# Patient Record
Sex: Female | Born: 1995
Health system: Southern US, Community
[De-identification: ages and names within clinical notes are randomized; demographics above are authoritative.]

## PROBLEM LIST (undated history)

## (undated) DIAGNOSIS — R51 Headache: Secondary | ICD-10-CM

## (undated) DIAGNOSIS — Q431 Hirschsprung's disease: Secondary | ICD-10-CM

## (undated) DIAGNOSIS — R519 Headache, unspecified: Secondary | ICD-10-CM

## (undated) DIAGNOSIS — T7840XA Allergy, unspecified, initial encounter: Secondary | ICD-10-CM

## (undated) DIAGNOSIS — N200 Calculus of kidney: Secondary | ICD-10-CM

## (undated) HISTORY — DX: Headache, unspecified: R51.9

## (undated) HISTORY — DX: Allergy, unspecified, initial encounter: T78.40XA

## (undated) HISTORY — DX: Headache: R51

## (undated) HISTORY — DX: Hirschsprung's disease: Q43.1

## (undated) HISTORY — DX: Calculus of kidney: N20.0

---
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
## 2018-11-24 DIAGNOSIS — J029 Acute pharyngitis, unspecified: Secondary | ICD-10-CM | POA: Diagnosis not present

## 2019-11-12 DIAGNOSIS — F064 Anxiety disorder due to known physiological condition: Secondary | ICD-10-CM | POA: Diagnosis not present

## 2019-11-12 DIAGNOSIS — Z30011 Encounter for initial prescription of contraceptive pills: Secondary | ICD-10-CM | POA: Diagnosis not present

## 2019-11-12 DIAGNOSIS — Z6827 Body mass index (BMI) 27.0-27.9, adult: Secondary | ICD-10-CM | POA: Diagnosis not present

## 2020-12-15 ENCOUNTER — Other Ambulatory Visit: Payer: Self-pay | Admitting: Family Medicine

## 2020-12-15 ENCOUNTER — Other Ambulatory Visit (HOSPITAL_COMMUNITY)
Admission: RE | Admit: 2020-12-15 | Discharge: 2020-12-15 | Disposition: A | Discharge status: Home / Self Care | Payer: BC Managed Care – PPO | Source: Ambulatory Visit | Attending: Family Medicine | Admitting: Family Medicine

## 2020-12-15 DIAGNOSIS — N898 Other specified noninflammatory disorders of vagina: Secondary | ICD-10-CM | POA: Insufficient documentation

## 2020-12-17 LAB — MOLECULAR ANCILLARY ONLY
Bacterial Vaginitis (gardnerella): POSITIVE — AB
Candida Glabrata: NEGATIVE
Candida Vaginitis: NEGATIVE
Comment: NEGATIVE
Comment: NEGATIVE
Comment: NEGATIVE

## 2022-09-14 ENCOUNTER — Ambulatory Visit (HOSPITAL_COMMUNITY): Payer: Self-pay

## 2022-09-19 ENCOUNTER — Other Ambulatory Visit: Payer: Self-pay | Admitting: Family Medicine

## 2022-09-19 DIAGNOSIS — R319 Hematuria, unspecified: Secondary | ICD-10-CM

## 2022-09-21 ENCOUNTER — Other Ambulatory Visit: Payer: Self-pay | Admitting: Family Medicine

## 2022-09-21 ENCOUNTER — Other Ambulatory Visit (HOSPITAL_COMMUNITY)
Admission: RE | Admit: 2022-09-21 | Discharge: 2022-09-21 | Disposition: A | Discharge status: Home / Self Care | Payer: BC Managed Care – PPO | Source: Ambulatory Visit | Attending: Family Medicine | Admitting: Family Medicine

## 2022-09-21 DIAGNOSIS — Z113 Encounter for screening for infections with a predominantly sexual mode of transmission: Secondary | ICD-10-CM | POA: Diagnosis present

## 2022-09-21 DIAGNOSIS — Z124 Encounter for screening for malignant neoplasm of cervix: Secondary | ICD-10-CM | POA: Diagnosis not present

## 2022-09-23 LAB — MOLECULAR ANCILLARY ONLY
Bacterial Vaginitis (gardnerella): POSITIVE — AB
Candida Glabrata: NEGATIVE
Candida Vaginitis: NEGATIVE
Chlamydia: NEGATIVE
Comment: NEGATIVE
Comment: NEGATIVE
Comment: NEGATIVE
Comment: NEGATIVE
Comment: NEGATIVE
Comment: NORMAL
Neisseria Gonorrhea: NEGATIVE
Trichomonas: NEGATIVE

## 2022-09-26 LAB — CYTOLOGY - PAP
Comment: NEGATIVE
Diagnosis: NEGATIVE
High risk HPV: NEGATIVE

## 2022-09-29 ENCOUNTER — Other Ambulatory Visit (HOSPITAL_BASED_OUTPATIENT_CLINIC_OR_DEPARTMENT_OTHER): Payer: Self-pay

## 2022-09-30 ENCOUNTER — Other Ambulatory Visit (HOSPITAL_COMMUNITY): Payer: Self-pay

## 2022-09-30 MED ORDER — ZEPBOUND 5 MG/0.5ML ~~LOC~~ SOAJ
5.0000 mg | SUBCUTANEOUS | 0 refills | Status: DC
  Filled 2022-09-30 (×2): qty 2, 28d supply, fill #0

## 2022-10-04 ENCOUNTER — Other Ambulatory Visit (HOSPITAL_COMMUNITY): Payer: Self-pay

## 2022-10-04 ENCOUNTER — Ambulatory Visit
Admission: RE | Admit: 2022-10-04 | Discharge: 2022-10-04 | Disposition: A | Discharge status: Home / Self Care | Payer: BC Managed Care – PPO | Source: Ambulatory Visit | Attending: Family Medicine | Admitting: Family Medicine

## 2022-10-04 DIAGNOSIS — R319 Hematuria, unspecified: Secondary | ICD-10-CM

## 2022-10-04 MED ORDER — METRONIDAZOLE 500 MG PO TABS
500.0000 mg | ORAL_TABLET | Freq: Two times a day (BID) | ORAL | 0 refills | Status: DC
  Filled 2022-10-04: qty 14, 7d supply, fill #0

## 2022-10-13 ENCOUNTER — Other Ambulatory Visit (HOSPITAL_COMMUNITY): Payer: Self-pay

## 2022-11-06 ENCOUNTER — Ambulatory Visit
Admission: EM | Admit: 2022-11-06 | Discharge: 2022-11-06 | Disposition: A | Discharge status: Home / Self Care | Payer: BC Managed Care – PPO | Attending: Emergency Medicine | Admitting: Emergency Medicine

## 2022-11-06 DIAGNOSIS — J101 Influenza due to other identified influenza virus with other respiratory manifestations: Secondary | ICD-10-CM | POA: Diagnosis not present

## 2022-11-06 LAB — POCT RAPID STREP A (OFFICE): Rapid Strep A Screen: NEGATIVE

## 2022-11-06 LAB — POCT INFLUENZA A/B
Influenza A, POC: NEGATIVE
Influenza B, POC: POSITIVE — AB

## 2022-11-06 MED ORDER — OSELTAMIVIR PHOSPHATE 75 MG PO CAPS: 75.0000 mg | ORAL_CAPSULE | Freq: Two times a day (BID) | ORAL | 0 refills | Status: DC

## 2022-11-06 MED ORDER — ACETAMINOPHEN 325 MG PO TABS
650.0000 mg | ORAL_TABLET | Freq: Once | ORAL | Status: AC
  Administered 2022-11-06: 650 mg via ORAL

## 2022-11-06 NOTE — Discharge Instructions (Addendum)
Take the Tamiflu as directed.    Take Tylenol or ibuprofen as needed for fever or discomfort.  Rest and keep yourself hydrated.    Follow-up with your primary care provider if your symptoms are not improving.

## 2022-11-06 NOTE — ED Triage Notes (Signed)
Patient to Urgent Care with complaints of generalized body aches, cough/ headache, fevers, and sore throat.  Symptoms started yesterday. Last dose of medication yesterday.

## 2022-11-06 NOTE — ED Provider Notes (Signed)
Roderic Palau    CSN: [id number] Arrival date & time: 11/06/22  [id number]      History   Chief Complaint Chief Complaint  Patient presents with   Sore Throat   Fever    HPI Jasmine Mcmahon is a 27 y.o. female.  Patient presents with fever, chills, body aches, headache, sore throat, cough x 1 day.  No OTC medications taken today.  She denies rash, chest pain, shortness of breath, vomiting, diarrhea, or other symptoms.  Her medical history includes Hirschsprung's disease, seasonal allergies, headache. The history is provided by the patient and medical records.    Past Medical History:  Diagnosis Date   Allergy    Headache    Hirschsprung's disease     There are no problems to display for this patient.   History reviewed. No pertinent surgical history.  OB History   No obstetric history on file.      Home Medications    Prior to Admission medications   Medication Sig Start Date End Date Taking? Authorizing Provider  oseltamivir (TAMIFLU) 75 MG capsule Take 1 capsule (75 mg total) by mouth every 12 (twelve) hours. 11/06/22  Yes Sharion Balloon, NP  LO LOESTRIN FE 1 MG-10 MCG / 10 MCG tablet Take 1 tablet by mouth daily. Patient not taking: Reported on 11/06/2022 09/01/16   Shelly Bombard, MD  metroNIDAZOLE (FLAGYL) 500 MG tablet Take 1 tablet (500 mg total) by mouth 2 (two) times daily. Patient not taking: Reported on 11/06/2022 10/03/22     ondansetron (ZOFRAN-ODT) 8 MG disintegrating tablet Take 1 tablet (8 mg total) by mouth every 8 (eight) hours as needed for nausea. 11/01/15   Wardell Honour, MD  tirzepatide [facility]) 5 MG/0.5ML Pen Inject 5 mg into the skin once a week. Patient not taking: Reported on 11/06/2022 09/29/22       Family History Family History  Problem Relation Age of Onset   Diabetes Mother    Hypertension Mother    Depression Mother     Social History Social History   Tobacco Use   Smoking status: Never   Smokeless tobacco: Never   Substance Use Topics   Alcohol use: No    Alcohol/week: 0.0 standard drinks of alcohol   Drug use: No     Allergies   Patient has no known allergies.   Review of Systems Review of Systems  Constitutional:  Positive for chills and fever.  HENT:  Positive for sore throat. Negative for ear pain.   Respiratory:  Positive for cough. Negative for shortness of breath.   Cardiovascular:  Negative for chest pain and palpitations.  Gastrointestinal:  Negative for abdominal pain, diarrhea and vomiting.  Skin:  Negative for color change and rash.  Neurological:  Positive for headaches.  All other systems reviewed and are negative.    Physical Exam Triage Vital Signs ED Triage Vitals  Enc Vitals Group     BP 11/06/22 0924 105/70     Pulse Rate 11/06/22 0901 (!) 107     Resp 11/06/22 0901 18     Temp 11/06/22 0901 (!) 103 F (39.4 C)     Temp src --      SpO2 11/06/22 0901 97 %     Weight --      Height --      Head Circumference --      Peak Flow --      Pain Score 11/06/22 0914 8  Pain Loc --      Pain Edu? --      Excl. in [place]? --    No data found.  Updated Vital Signs BP 105/70   Pulse (!) 107   Temp 99.5 F (37.5 C)   Resp 18   LMP 10/02/2022   SpO2 97%   Visual Acuity Right Eye Distance:   Left Eye Distance:   Bilateral Distance:    Right Eye Near:   Left Eye Near:    Bilateral Near:     Physical Exam Vitals and nursing note reviewed.  Constitutional:      General: She is not in acute distress.    Appearance: She is well-developed. She is ill-appearing.  HENT:     Right Ear: Tympanic membrane normal.     Left Ear: Tympanic membrane normal.     Nose: Nose normal.     Mouth/Throat:     Mouth: Mucous membranes are moist.     Pharynx: Oropharynx is clear.  Cardiovascular:     Rate and Rhythm: Normal rate and regular rhythm.     Heart sounds: Normal heart sounds.  Pulmonary:     Effort: Pulmonary effort is normal. No respiratory distress.      Breath sounds: Normal breath sounds.  Musculoskeletal:     Cervical back: Neck supple.  Skin:    General: Skin is warm and dry.  Neurological:     Mental Status: She is alert.  Psychiatric:        Mood and Affect: Mood normal.        Behavior: Behavior normal.      UC Treatments / Results  Labs (all labs ordered are listed, but only abnormal results are displayed) Labs Reviewed  POCT INFLUENZA A/B - Abnormal; Notable for the following components:      Result Value   Influenza B, POC Positive (*)    All other components within normal limits  POCT RAPID STREP A (OFFICE)    EKG   Radiology No results found.  Procedures Procedures (including critical care time)  Medications Ordered in UC Medications  acetaminophen (TYLENOL) tablet 650 mg (650 mg Oral Given 11/06/22 0925)    Initial Impression / Assessment and Plan / UC Course  I have reviewed the triage vital signs and the nursing notes.  Pertinent labs & imaging results that were available during my care of the patient were reviewed by me and considered in my medical decision making (see chart for details).    Influenza B.  Rapid influenza test positive for B.  Treating with Tamiflu.   Discussed symptomatic treatment including Tylenol, rest, hydration.  Instructed patient to follow up with her PCP if symptoms are not improving.  ED precautions given.  Education provided on influenza.  She agrees to plan of care.   Final Clinical Impressions(s) / UC Diagnoses   Final diagnoses:  Influenza B     Discharge Instructions      Take the Tamiflu as directed.    Take Tylenol or ibuprofen as needed for fever or discomfort.  Rest and keep yourself hydrated.    Follow-up with your primary care provider if your symptoms are not improving.         ED Prescriptions     Medication Sig Dispense Auth. Provider   oseltamivir (TAMIFLU) 75 MG capsule Take 1 capsule (75 mg total) by mouth every 12 (twelve) hours. 10  capsule Sharion Balloon, NP      PDMP not  reviewed this encounter.   Sharion Balloon, NP 11/06/22 [phone]

## 2022-11-14 ENCOUNTER — Other Ambulatory Visit: Payer: Self-pay

## 2022-11-14 ENCOUNTER — Emergency Department (HOSPITAL_COMMUNITY)
Admission: EM | Admit: 2022-11-14 | Discharge: 2022-11-14 | Disposition: A | Discharge status: Home / Self Care | Payer: BC Managed Care – PPO | Attending: Emergency Medicine | Admitting: Emergency Medicine

## 2022-11-14 ENCOUNTER — Encounter (HOSPITAL_COMMUNITY): Payer: Self-pay

## 2022-11-14 ENCOUNTER — Emergency Department (HOSPITAL_COMMUNITY): Payer: BC Managed Care – PPO

## 2022-11-14 DIAGNOSIS — N201 Calculus of ureter: Secondary | ICD-10-CM | POA: Diagnosis not present

## 2022-11-14 DIAGNOSIS — M545 Low back pain, unspecified: Secondary | ICD-10-CM | POA: Diagnosis present

## 2022-11-14 DIAGNOSIS — N132 Hydronephrosis with renal and ureteral calculous obstruction: Secondary | ICD-10-CM

## 2022-11-14 DIAGNOSIS — N1330 Unspecified hydronephrosis: Secondary | ICD-10-CM | POA: Diagnosis not present

## 2022-11-14 LAB — BASIC METABOLIC PANEL
Anion gap: 12 (ref 5–15)
BUN: 8 mg/dL (ref 6–20)
CO2: 22 mmol/L (ref 22–32)
Calcium: 9.4 mg/dL (ref 8.9–10.3)
Chloride: 104 mmol/L (ref 98–111)
Creatinine, Ser: 0.93 mg/dL (ref 0.44–1.00)
GFR, Estimated: >60 mL/min (ref >60)
Glucose, Bld: 94 mg/dL (ref 70–99)
Potassium: 3.3 mmol/L — ABNORMAL LOW (ref 3.5–5.1)
Sodium: 138 mmol/L (ref 135–145)

## 2022-11-14 LAB — URINALYSIS, ROUTINE W REFLEX MICROSCOPIC
Bacteria, UA: NONE SEEN
Bilirubin Urine: NEGATIVE
Glucose, UA: NEGATIVE mg/dL
Ketones, ur: 80 mg/dL — AB
Nitrite: NEGATIVE
Protein, ur: 30 mg/dL — AB
RBC / HPF: >50 RBC/hpf (ref 0–5)
Specific Gravity, Urine: 1.018 (ref 1.005–1.030)
pH: 6 (ref 5.0–8.0)

## 2022-11-14 LAB — CBC WITH DIFFERENTIAL/PLATELET
Abs Immature Granulocytes: 0.08 10*3/uL — ABNORMAL HIGH (ref 0.00–0.07)
Basophils Absolute: 0 10*3/uL (ref 0.0–0.1)
Basophils Relative: 0 %
Eosinophils Absolute: 0 10*3/uL (ref 0.0–0.5)
Eosinophils Relative: 0 %
HCT: 35.4 % — ABNORMAL LOW (ref 36.0–46.0)
Hemoglobin: 11.8 g/dL — ABNORMAL LOW (ref 12.0–15.0)
Immature Granulocytes: 1 %
Lymphocytes Relative: 21 %
Lymphs Abs: 2.2 10*3/uL (ref 0.7–4.0)
MCH: 30.2 pg (ref 26.0–34.0)
MCHC: 33.3 g/dL (ref 30.0–36.0)
MCV: 90.5 fL (ref 80.0–100.0)
Monocytes Absolute: 0.7 10*3/uL (ref 0.1–1.0)
Monocytes Relative: 7 %
Neutro Abs: 7.2 10*3/uL (ref 1.7–7.7)
Neutrophils Relative %: 71 %
Platelets: 382 10*3/uL (ref 150–400)
RBC: 3.91 MIL/uL (ref 3.87–5.11)
RDW: 12.2 % (ref 11.5–15.5)
WBC: 10.2 10*3/uL (ref 4.0–10.5)
nRBC: 0 % (ref 0.0–0.2)

## 2022-11-14 LAB — I-STAT BETA HCG BLOOD, ED (MC, WL, AP ONLY): I-stat hCG, quantitative: <5 m[IU]/mL (ref <5)

## 2022-11-14 MED ORDER — KETOROLAC TROMETHAMINE 15 MG/ML IJ SOLN
15.0000 mg | Freq: Once | INTRAMUSCULAR | Status: AC
  Administered 2022-11-14: 15 mg via INTRAMUSCULAR
  Filled 2022-11-14: qty 1

## 2022-11-14 MED ORDER — ONDANSETRON 4 MG PO TBDP
4.0000 mg | ORAL_TABLET | Freq: Once | ORAL | Status: AC
  Administered 2022-11-14: 4 mg via ORAL
  Filled 2022-11-14: qty 1

## 2022-11-14 MED ORDER — ONDANSETRON HCL 4 MG PO TABS: 4.0000 mg | ORAL_TABLET | Freq: Three times a day (TID) | ORAL | 0 refills | Status: DC | PRN

## 2022-11-14 MED ORDER — TAMSULOSIN HCL 0.4 MG PO CAPS: 0.4000 mg | ORAL_CAPSULE | Freq: Every day | ORAL | 0 refills | Status: AC

## 2022-11-14 MED ORDER — IBUPROFEN 600 MG PO TABS: 600.0000 mg | ORAL_TABLET | Freq: Four times a day (QID) | ORAL | 0 refills | Status: DC | PRN

## 2022-11-14 MED ORDER — OXYCODONE HCL 5 MG PO TABS: 5.0000 mg | ORAL_TABLET | ORAL | 0 refills | Status: DC | PRN

## 2022-11-14 MED ORDER — IOHEXOL 350 MG/ML SOLN
75.0000 mL | Freq: Once | INTRAVENOUS | Status: AC | PRN
  Administered 2022-11-14: 75 mL via INTRAVENOUS

## 2022-11-14 NOTE — ED Notes (Signed)
Pt medicated per mar pt tearful will continue to monitor

## 2022-11-14 NOTE — ED Provider Notes (Signed)
[facility] Provider Note   CSN: [id number] Arrival date & time: 11/14/22  1630     History  Chief Complaint  Patient presents with   Back Pain    Jasmine Mcmahon is a 27 y.o. female.  Patient presents the emergency department complaining of left-sided flank pain which radiates to the abdomen which began today.  Patient also complains of nausea and hematuria.  Patient reportedly went to her PCP who sent her here to rule out kidney stones.  The patient does appear tearful upon arrival.  She states that she has been having some painless hematuria for the past few months and has a referral for urology.  Past medical history significant for allergies, Hirschsprung's disease, headaches  HPI     Home Medications Prior to Admission medications   Medication Sig Start Date End Date Taking? Authorizing Provider  ibuprofen (ADVIL) 600 MG tablet Take 1 tablet (600 mg total) by mouth every 6 (six) hours as needed. 11/14/22  Yes Cherlynn June B, PA-C  ondansetron (ZOFRAN) 4 MG tablet Take 1 tablet (4 mg total) by mouth every 8 (eight) hours as needed for nausea or vomiting. 11/14/22  Yes Dorothyann Peng, PA-C  oxyCODONE (ROXICODONE) 5 MG immediate release tablet Take 1 tablet (5 mg total) by mouth every 4 (four) hours as needed for severe pain. 11/14/22  Yes Dorothyann Peng, PA-C  tamsulosin (FLOMAX) 0.4 MG CAPS capsule Take 1 capsule (0.4 mg total) by mouth daily. 11/14/22 12/14/22 Yes Lexiana Spindel B, PA-C  LO LOESTRIN FE 1 MG-10 MCG / 10 MCG tablet Take 1 tablet by mouth daily. Patient not taking: Reported on 11/06/2022 09/01/16   Shelly Bombard, MD  metroNIDAZOLE (FLAGYL) 500 MG tablet Take 1 tablet (500 mg total) by mouth 2 (two) times daily. Patient not taking: Reported on 11/06/2022 10/03/22     ondansetron (ZOFRAN-ODT) 8 MG disintegrating tablet Take 1 tablet (8 mg total) by mouth every 8 (eight) hours as needed for nausea. 11/01/15   Wardell Honour, MD  oseltamivir (TAMIFLU) 75 MG capsule Take 1 capsule (75 mg total) by mouth every 12 (twelve) hours. 11/06/22   Sharion Balloon, NP  tirzepatide [facility]) 5 MG/0.5ML Pen Inject 5 mg into the skin once a week. Patient not taking: Reported on 11/06/2022 09/29/22         Allergies    Patient has no known allergies.    Review of Systems   Review of Systems  Constitutional:  Negative for fever.  Respiratory:  Negative for shortness of breath.   Cardiovascular:  Negative for chest pain.  Gastrointestinal:  Positive for abdominal pain and nausea.  Genitourinary:  Positive for dysuria, flank pain and hematuria.    Physical Exam Updated Vital Signs BP 125/77 (BP Location: Right Arm)   Pulse 61   Temp 98.1 F (36.7 C) (Oral)   Resp 16   Ht '5\' 7"'$  (1.702 m)   Wt 82.1 kg   LMP 11/07/2022   SpO2 100%   BMI 28.35 kg/m  Physical Exam Vitals and nursing note reviewed.  Constitutional:      General: She is not in acute distress.    Appearance: She is well-developed.  HENT:     Head: Normocephalic and atraumatic.  Eyes:     Conjunctiva/sclera: Conjunctivae normal.  Cardiovascular:     Rate and Rhythm: Normal rate and regular rhythm.     Heart sounds: No murmur heard. Pulmonary:  Effort: Pulmonary effort is normal. No respiratory distress.     Breath sounds: Normal breath sounds.  Abdominal:     Palpations: Abdomen is soft.     Tenderness: There is abdominal tenderness. There is left CVA tenderness.  Musculoskeletal:        General: No swelling.     Cervical back: Neck supple.  Skin:    General: Skin is warm and dry.     Capillary Refill: Capillary refill takes less than 2 seconds.  Neurological:     Mental Status: She is alert.  Psychiatric:        Mood and Affect: Mood normal.     ED Results / Procedures / Treatments   Labs (all labs ordered are listed, but only abnormal results are displayed) Labs Reviewed  CBC WITH DIFFERENTIAL/PLATELET - Abnormal; Notable for  the following components:      Result Value   Hemoglobin 11.8 (*)    HCT 35.4 (*)    Abs Immature Granulocytes 0.08 (*)    All other components within normal limits  BASIC METABOLIC PANEL - Abnormal; Notable for the following components:   Potassium 3.3 (*)    All other components within normal limits  URINALYSIS, ROUTINE W REFLEX MICROSCOPIC - Abnormal; Notable for the following components:   APPearance CLOUDY (*)    Hgb urine dipstick LARGE (*)    Ketones, ur 80 (*)    Protein, ur 30 (*)    Leukocytes,Ua TRACE (*)    All other components within normal limits  I-STAT BETA HCG BLOOD, ED ([facility], [facility], [facility] ONLY)    EKG None  Radiology CT ABDOMEN PELVIS W CONTRAST  Result Date: 11/14/2022 CLINICAL DATA:  Abdominal pain, left side back pain, nausea EXAM: CT ABDOMEN AND PELVIS WITH CONTRAST TECHNIQUE: Multidetector CT imaging of the abdomen and pelvis was performed using the standard protocol following bolus administration of intravenous contrast. RADIATION DOSE REDUCTION: This exam was performed according to the departmental dose-optimization program which includes automated exposure control, adjustment of the mA and/or kV according to patient size and/or use of iterative reconstruction technique. CONTRAST:  48m OMNIPAQUE IOHEXOL 350 MG/ML SOLN COMPARISON:  None Available. FINDINGS: Lower chest: No acute abnormality Hepatobiliary: No focal hepatic abnormality. Gallbladder unremarkable. Pancreas: No focal abnormality or ductal dilatation. Spleen: No focal abnormality.  Normal size. Adrenals/Urinary Tract: Left hydronephrosis and hydroureter to the pelvic brim due to 6 mm mid left ureteral stone. Delayed excretion of contrast from the left kidney. Right kidney and adrenal glands unremarkable. Urinary bladder normal. Stomach/Bowel: Normal appendix. Stomach, large and small bowel grossly unremarkable. Vascular/Lymphatic: No evidence of aneurysm or adenopathy. Reproductive: Uterus and adnexa unremarkable.   No mass. Other: No free fluid or free air. Musculoskeletal: 5 no acute bony abnormality. IMPRESSION: 6 mm mid left ureteral stone with mild left hydronephrosis. Electronically Signed   By: KRolm BaptiseM.D.   On: 11/14/2022 19:22    Procedures Procedures    Medications Ordered in ED Medications  ketorolac (TORADOL) 15 MG/ML injection 15 mg (15 mg Intramuscular Given 11/14/22 1813)  ondansetron (ZOFRAN-ODT) disintegrating tablet 4 mg (4 mg Oral Given 11/14/22 1814)  iohexol (OMNIPAQUE) 350 MG/ML injection 75 mL (75 mLs Intravenous Contrast Given 11/14/22 1911)    ED Course/ Medical Decision Making/ A&P                             Medical Decision Making Amount and/or Complexity of Data Reviewed  Radiology: ordered.  Risk Prescription drug management.   This patient presents to the ED for concern of left-sided flank pain, this involves an extensive number of treatment options, and is a complaint that carries with it a high risk of complications and morbidity.  The differential diagnosis includes nephrolithiasis, pyelonephritis, hydronephrosis, musculoskeletal pain, others   Co morbidities that complicate the patient evaluation  Hematuria   Additional history obtained:  Additional history obtained from family at bedside External records from outside source obtained and reviewed including urgent care notes from February 25 where the patient was diagnosed with influenza   Lab Tests:  I Ordered, and personally interpreted labs.  The pertinent results include: Potassium 3.3, negative i-STAT beta pregnancy test, hematuria with trace leukocytes on UA   Imaging Studies ordered:  I ordered imaging studies including CT abdomen pelvis with contrast I independently visualized and interpreted imaging which showed 6 mm mid left ureteral stone with mild left hydronephrosis  I agree with the radiologist interpretation   Problem List / ED Course / Critical interventions / Medication  management   I ordered medication including Toradol for inflammation, Zofran for nausea Reevaluation of the patient after these medicines showed that the patient improved I have reviewed the patients home medicines and have made adjustments as needed   Test / Admission - Considered:  Patient does have a 6 mm stone in the left ureter with mild left hydronephrosis.  Plan to discharge patient home at this time with prescriptions for symptomatic ureteral stone including scheduled ibuprofen, Flomax, Zofran, and Roxicodone for breakthrough pain.  Urology referral information provided.  Return precautions provided.        Final Clinical Impression(s) / ED Diagnoses Final diagnoses:  Ureteral stone with hydronephrosis    Rx / DC Orders ED Discharge Orders          Ordered    ibuprofen (ADVIL) 600 MG tablet  Every 6 hours PRN        11/14/22 1951    tamsulosin (FLOMAX) 0.4 MG CAPS capsule  Daily        11/14/22 1951    ondansetron (ZOFRAN) 4 MG tablet  Every 8 hours PRN        11/14/22 1951    oxyCODONE (ROXICODONE) 5 MG immediate release tablet  Every 4 hours PRN        11/14/22 1951              Ronny Bacon 11/14/22 1953    Elgie Congo, MD 11/14/22 [phone]

## 2022-11-14 NOTE — Discharge Instructions (Addendum)
You were diagnosed today with a stone in the left ureter causing mild swelling of the left kidney.  This was 6 mm in size and will hopefully pass on its own.  I have prescribed ibuprofen which is to be taken every 6 hours, Roxicodone for breakthrough pain, Zofran for nausea, and Flomax to be taken daily.  I have attached contact information for urology.  Please contact them for a follow-up appointment.  If you become unable to urinate, unable to tolerate oral fluids even with nausea medication, or pain becomes unbearable, please return to the emergency department.

## 2022-11-14 NOTE — ED Triage Notes (Signed)
Pt reports left sided back pain that radiates to her abd along with nausea and hematuria. Pt went to PCP, sent here to rule out kidney stones. Pt tearful in triage

## 2022-11-14 NOTE — ED Notes (Signed)
Pt c/o lower abdominal pain with right sided lower back pain pt reports that she does have urology appointment in April Pt reports hematuria since November Pt went to PCP but was sent here to r/o kidney stone Pt reports that she just got over the flu took tamiflu

## 2022-12-19 ENCOUNTER — Ambulatory Visit (INDEPENDENT_AMBULATORY_CARE_PROVIDER_SITE_OTHER): Payer: BC Managed Care – PPO | Admitting: Obstetrics and Gynecology

## 2022-12-19 ENCOUNTER — Encounter: Payer: Self-pay | Admitting: Obstetrics and Gynecology

## 2022-12-19 VITALS — BP 111/71 | HR 98 | Ht 66.5 in | Wt 181.0 lb

## 2022-12-19 DIAGNOSIS — R310 Gross hematuria: Secondary | ICD-10-CM | POA: Diagnosis not present

## 2022-12-19 DIAGNOSIS — N200 Calculus of kidney: Secondary | ICD-10-CM | POA: Diagnosis not present

## 2022-12-19 DIAGNOSIS — R350 Frequency of micturition: Secondary | ICD-10-CM

## 2022-12-19 DIAGNOSIS — N941 Unspecified dyspareunia: Secondary | ICD-10-CM | POA: Diagnosis not present

## 2022-12-19 DIAGNOSIS — M62838 Other muscle spasm: Secondary | ICD-10-CM

## 2022-12-19 NOTE — Patient Instructions (Signed)
  Kidney stones can form due to one or all of the following:  1. Low fluid Intake 2. Low citrate in the urine 3. Too much salt, oxalate, and animal protein  The following dietary changes may decrease your risk of forming stones.  1. Increase your fluid intake to around 2.5 - 3 liters per day. Diluting the urine hinders the formation of stones. You should drink enough fluid to make 2 liters of urine a day. You know you are drinking enough fluid if your urine is clear.  2. Decrease your salt intake to less than 2000mg /day This will decrease the amount of calcium excreted in your urine. Eat more fresh or frozen vegetables instead of canned. Eat less processed meats. At restaurants request your food to be prepared without salt or high sodium seasoning.  3. Limit the amount of Oxalate containing foods in your diet to 50mg  per day. This will decrease the amount of oxalate excreted in your urine.  Nuts Peanut Butter Chocolate Spinach Rhubarb Tea Black Pepper Strawberries Tofu  4. Limit the amount of animal proteins in your diet to less than 15 oz per day. This will decrease the amount of uric acid excreted in your urine.  Fish Liver Chicken Red Meat - no more than 3 servings per week  5. Increase the amount of citrate in your diet. This will decrease the amount of calcium in your urine. 1/2 cup concentrated lemon juice to 2 quarts of water. Sweeten to taste.

## 2022-12-19 NOTE — Progress Notes (Signed)
[facility] New Patient Evaluation and Consultation  Referring Provider: Jeri Lager, * PCP: Renaye Rakers, MD Date of Service: 12/19/2022  SUBJECTIVE Chief Complaint: New Patient (Initial Visit)  History of Present Illness: Jasmine Mcmahon is a 27 y.o. White or Caucasian female seen in consultation at the request of FNP Derenda Mis for evaluation of hematuria.    Review of records significant for:  Had renal US 09/2022 which was negative for renal stones after episode of hematuria.   Then went to ER 11/2022 and CT w/ contrast was performed which showed:  IMPRESSION: 6 mm mid left ureteral stone with mild left hydronephrosis.  Urinary Symptoms: Does not leak urine.   Day time voids 4.  Nocturia: 0 times per night to void. Voiding dysfunction: she empties her bladder well.  does not use a catheter to empty bladder.  When urinating, she feels she has no difficulties Drinks: 80 oz water, was drinking a lot of soda and stopped that recently.  Lost about 40 lbs with monjuaro.   UTIs: 1 UTI's in the last year.   Reports history of kidney or bladder stones  She was having some burning with urination earlier this week. Has also been seeing some blood in the urine.   Pelvic Organ Prolapse Symptoms:                  She Denies a feeling of a bulge the vaginal area.   Bowel Symptom: Bowel movements: 1 time(s) per day Stool consistency: hard Straining: no.  Splinting: no.  Incomplete evacuation: yes.  She Denies accidental bowel leakage / fecal incontinence Bowel regimen: miralax   Sexual Function Sexually active: yes.  Sexual orientation:  heterosexual Pain with sex: Yes, deep in the pelvis, has discomfort due to dryness Always has pain, can be with any position.   Pelvic Pain Denies pelvic pain   Past Medical History:  Past Medical History:  Diagnosis Date   Allergy    Headache    Hirschsprung's disease      Past Surgical History:   History reviewed. No pertinent surgical history.   Past OB/GYN History: OB History  No obstetric history on file.    Patient's last menstrual period was 12/15/2022. Contraception: condoms. Last pap smear was Jan 2024   Medications: She has a current medication list which includes the following prescription(s): ibuprofen, lo loestrin fe, metronidazole, ondansetron, ondansetron, oseltamivir, oxycodone, and zepbound.   Allergies: Patient has No Known Allergies.   Social History:  Social History   Tobacco Use   Smoking status: Never   Smokeless tobacco: Never  Substance Use Topics   Alcohol use: No    Alcohol/week: 0.0 standard drinks of alcohol   Drug use: No    Relationship status: long-term partner She lives with her fiance.   She is employed as a Runner, broadcasting/film/video. Regular exercise: No History of abuse: No  Family History:   Family History  Problem Relation Age of Onset   Diabetes Mother    Hypertension Mother    Depression Mother      Review of Systems: Review of Systems  Constitutional:  Negative for fever, malaise/fatigue and weight loss.  Respiratory:  Negative for cough, shortness of breath and wheezing.   Cardiovascular:  Negative for chest pain, palpitations and leg swelling.  Gastrointestinal:  Negative for abdominal pain and blood in stool.  Genitourinary:  Negative for dysuria.  Musculoskeletal:  Negative for myalgias.  Skin:  Negative for rash.  Neurological:  Positive for headaches. Negative for dizziness.  Endo/Heme/Allergies:  Does not bruise/bleed easily.  Psychiatric/Behavioral:  Positive for depression. The patient is nervous/anxious.      OBJECTIVE Physical Exam: Vitals:   12/19/22 1510  BP: 111/71  Pulse: 98  Weight: 181 lb (82.1 kg)  Height: 5' 6.5" (1.689 m)    Physical Exam Constitutional:      General: She is not in acute distress. Pulmonary:     Effort: Pulmonary effort is normal.  Abdominal:     General: There is no distension.      Palpations: Abdomen is soft.     Tenderness: There is no abdominal tenderness. There is no rebound.  Musculoskeletal:        General: No swelling. Normal range of motion.  Skin:    General: Skin is warm and dry.     Findings: No rash.  Neurological:     Mental Status: She is alert and oriented to person, place, and time.  Psychiatric:        Mood and Affect: Mood normal.        Behavior: Behavior normal.      GU / Detailed Urogynecologic Evaluation:  Pelvic Exam: Normal external female genitalia; Bartholin's and Skene's glands normal in appearance; urethral meatus normal in appearance, no urethral masses or discharge.   CST: negative  Speculum exam reveals normal vaginal mucosa with atrophy. Cervix normal appearance. Small amount of blood in the vaginal vault. Uterus normal single, nontender. Adnexa normal adnexa.     Pelvic floor strength II/V  Pelvic floor musculature: Right levator tender, Right obturator tender, Left levator tender, Left obturator tender  POP-Q:   POP-Q                                               Aa                                               Ba                                                 C                                                Gh                                               Pb                                               tvl  Ap                                               Bp                                                 D      Rectal Exam:  Normal external rectum  Post-Void Residual (PVR) by Bladder Scan: Unable to void  Laboratory Results:   ASSESSMENT AND PLAN [name] is a 27 y.o. with:  1. Gross hematuria   2. Kidney stone   3. Urinary frequency   4. Dyspareunia, female   5. Levator spasm     Gross hematuria/ Kidney stone - We discussed that the presence of the stone in the left ureter is likely the cause of the hematuria.  - She continues to  have intermittent pain and does not believe she passed a stone.  - Will refer to [facility] for further evaluation.   2. Dysparunia/ levator spasm - The origin of pelvic floor muscle spasm can be multifactorial, including primary, reactive to a different pain source, trauma, or even part of a centralized pain syndrome.Treatment options include pelvic floor physical therapy, local (vaginal) or oral  muscle relaxants, pelvic muscle trigger point injections or centrally acting pain medications.   - She will start with pelvic PT, referral placed.   Return as needed.   Marguerita Beards, MD

## 2023-02-28 ENCOUNTER — Ambulatory Visit: Payer: BC Managed Care – PPO | Admitting: Urology

## 2023-03-30 ENCOUNTER — Encounter: Payer: Self-pay | Admitting: Urology

## 2023-03-30 ENCOUNTER — Ambulatory Visit
Admission: RE | Admit: 2023-03-30 | Discharge: 2023-03-30 | Disposition: A | Discharge status: Home / Self Care | Payer: BC Managed Care – PPO | Source: Ambulatory Visit | Attending: Urology | Admitting: Urology

## 2023-03-30 ENCOUNTER — Ambulatory Visit: Payer: BC Managed Care – PPO | Admitting: Urology

## 2023-03-30 VITALS — BP 118/73 | HR 69 | Ht 66.5 in | Wt 180.2 lb

## 2023-03-30 DIAGNOSIS — R399 Unspecified symptoms and signs involving the genitourinary system: Secondary | ICD-10-CM

## 2023-03-30 DIAGNOSIS — R319 Hematuria, unspecified: Secondary | ICD-10-CM

## 2023-03-30 DIAGNOSIS — N941 Unspecified dyspareunia: Secondary | ICD-10-CM

## 2023-03-30 DIAGNOSIS — N201 Calculus of ureter: Secondary | ICD-10-CM

## 2023-03-30 NOTE — Progress Notes (Addendum)
   03/30/23 4:00 PM   Jasmine Mcmahon [birth year] [id number]  CC: Left ureteral stone, urinary symptoms  HPI: 27 year old female who presents with primarily urinary symptoms with urgency, sensation of incomplete emptying, and pain with sexual activity.  She developed hematuria in January 2024 and a renal ultrasound was benign.  She then developed severe left-sided flank pain and was seen in urgent care in March 2024 and a CT showed a 6 mm mid left ureteral stone at the level of the iliac vessels with mild hydronephrosis.  She does not think she ever passed the stone, never saw stone pass.  She has not had any hematuria in the last few months, or any flank pain, but she has had worsening urinary symptoms since that time with urgency, pelvic discomfort, and sensation that she has a UTI.  Cultures have reportedly been negative.  She was also seen by urogynecology in April 2024 and they felt this could be related to a persistent stone.  This may have also been related to pelvic floor dysfunction and they recommended pelvic floor physical therapy.   PMH: Past Medical History:  Diagnosis Date   Allergy    Headache    Hirschsprung's disease    Kidney stone      Family History: Family History  Problem Relation Age of Onset   Diabetes Mother    Hypertension Mother    Depression Mother     Social History:  reports that she has never smoked. She has never used smokeless tobacco. She reports that she does not drink alcohol and does not use drugs.  Physical Exam: BP 118/73 (BP Location: Left Arm, Patient Position: Sitting, Cuff Size: Normal)   Pulse 69   Ht 5' 6.5" (1.689 m)   Wt 180 lb 3.2 oz (81.7 kg)   BMI 28.65 kg/m    Constitutional:  Alert and oriented, No acute distress. Cardiovascular: No clubbing, cyanosis, or edema. Respiratory: Normal respiratory effort, no increased work of breathing. GI: Abdomen is soft, nontender, nondistended, no abdominal masses  Laboratory  Data: Reviewed  Pertinent Imaging: I have personally viewed and interpreted the CT dated 11/14/2022 showing a 6 mm left mid ureteral stone with hydronephrosis.  Assessment & Plan:   27 year old female with hematuria starting in January 2024, ultimately with severe left flank pain and CT showing a 6 mm left mid ureteral stone in March 2024.  Her left-sided flank pain has resolved, she never saw a stone pass, hematuria resolved over the last few months, but has had bothersome urinary symptoms since that time with sensation of UTI despite negative cultures, as well as painful sexual activity.  High suspicion her symptoms may be related to distal migration of the stone.  I recommended a KUB today for further evaluation.  If no evidence of stone, would benefit from pelvic floor physical therapy.  If stone present, she prefers shockwave lithotripsy for treatment.  We discussed the risks and benefits as well as the differences between shockwave lithotripsy and ureteroscopy.  She would like to avoid general anesthesia and ureteral stent.  UA(culture and atypicals), and KUB today, call with results If stone present, pursue left shockwave lithotripsy   Legrand Rams, MD 03/30/2023  [facility] [address] [place], [place] [address] [phone]

## 2023-03-30 NOTE — H&P (View-Only) (Signed)
   03/30/23 4:00 PM   Mckenzye T Kwong [birth year] [id number]  CC: Left ureteral stone, urinary symptoms  HPI: 27 year old female who presents with primarily urinary symptoms with urgency, sensation of incomplete emptying, and pain with sexual activity.  She developed hematuria in January 2024 and a renal ultrasound was benign.  She then developed severe left-sided flank pain and was seen in urgent care in March 2024 and a CT showed a 6 mm mid left ureteral stone at the level of the iliac vessels with mild hydronephrosis.  She does not think she ever passed the stone, never saw stone pass.  She has not had any hematuria in the last few months, or any flank pain, but she has had worsening urinary symptoms since that time with urgency, pelvic discomfort, and sensation that she has a UTI.  Cultures have reportedly been negative.  She was also seen by urogynecology in April 2024 and they felt this could be related to a persistent stone.  This may have also been related to pelvic floor dysfunction and they recommended pelvic floor physical therapy.   PMH: Past Medical History:  Diagnosis Date   Allergy    Headache    Hirschsprung's disease    Kidney stone      Family History: Family History  Problem Relation Age of Onset   Diabetes Mother    Hypertension Mother    Depression Mother     Social History:  reports that she has never smoked. She has never used smokeless tobacco. She reports that she does not drink alcohol and does not use drugs.  Physical Exam: BP 118/73 (BP Location: Left Arm, Patient Position: Sitting, Cuff Size: Normal)   Pulse 69   Ht 5' 6.5" (1.689 m)   Wt 180 lb 3.2 oz (81.7 kg)   BMI 28.65 kg/m    Constitutional:  Alert and oriented, No acute distress. Cardiovascular: No clubbing, cyanosis, or edema. Respiratory: Normal respiratory effort, no increased work of breathing. GI: Abdomen is soft, nontender, nondistended, no abdominal masses  Laboratory  Data: Reviewed  Pertinent Imaging: I have personally viewed and interpreted the CT dated 11/14/2022 showing a 6 mm left mid ureteral stone with hydronephrosis.  Assessment & Plan:   27 year old female with hematuria starting in January 2024, ultimately with severe left flank pain and CT showing a 6 mm left mid ureteral stone in March 2024.  Her left-sided flank pain has resolved, she never saw a stone pass, hematuria resolved over the last few months, but has had bothersome urinary symptoms since that time with sensation of UTI despite negative cultures, as well as painful sexual activity.  High suspicion her symptoms may be related to distal migration of the stone.  I recommended a KUB today for further evaluation.  If no evidence of stone, would benefit from pelvic floor physical therapy.  If stone present, she prefers shockwave lithotripsy for treatment.  We discussed the risks and benefits as well as the differences between shockwave lithotripsy and ureteroscopy.  She would like to avoid general anesthesia and ureteral stent.  UA(culture and atypicals), and KUB today, call with results If stone present, pursue left shockwave lithotripsy   Legrand Rams, MD 03/30/2023  [facility] [address] [place], [place] [address] [phone]

## 2023-03-30 NOTE — Addendum Note (Signed)
Addended by: Sueanne Margarita on: 03/30/2023 04:18 PM   Modules accepted: Orders

## 2023-03-30 NOTE — Patient Instructions (Signed)
ESWL for Kidney Stones  Extracorporeal shock wave lithotripsy (ESWL) is a treatment that can help break up kidney stones that are too large to pass on their own.  This is a nonsurgical procedure that breaks up a kidney stone with shock waves. These shock waves pass through your body and focus on the kidney stone. They cause the kidney stone to break into smaller pieces (fragments) while it is still in the urinary tract. The fragments of stone can pass more easily out of your body in the urine. Tell a health care provider about: Any allergies you have. All medicines you are taking, including vitamins, herbs, eye drops, creams, and over-the-counter medicines. Any problems you or family members have had with anesthetic medicines. Any bleeding problems you have. Any surgeries you have had. Any medical conditions you have. Whether you are pregnant or may be pregnant. What are the risks? Your health care provider will talk with you about risks. These may include: Infection. Bleeding from the kidney. Bruising of the kidney or skin. Scarring of the kidney. This can lead to: Increased blood pressure. Poor kidney function. Return (recurrence) of kidney stones. Damage to other structures or organs. This may include the liver, colon, spleen, or pancreas. Blockage (obstruction) of the tube that carries urine from the kidney to the bladder (ureter). Failure of the kidney stone to break into fragments. What happens before the procedure? When to stop eating and drinking Follow instructions from your health care provider about what you may eat and drink. These may include: 8 hours before your procedure Stop eating most foods. Do not eat meat, fried foods, or fatty foods. Eat only light foods, such as toast or crackers. All liquids are okay except energy drinks and alcohol. 6 hours before your procedure Stop eating. Drink only clear liquids, such as water, clear fruit juice, black coffee, plain tea,  and sports drinks. Do not drink energy drinks or alcohol. 2 hours before your procedure Stop drinking all liquids. You may be allowed to take medicines with small sips of water. If you do not follow your health care provider's instructions, your procedure may be delayed or canceled. Medicines Ask your health care provider about: Changing or stopping your regular medicines. These include any diabetes medicines or blood thinners you take. Taking medicines such as aspirin and ibuprofen. These medicines can thin your blood. Do not take them unless your health care provider tells you to. Taking over-the-counter medicines, vitamins, herbs, and supplements. Tests You may have tests, such as: Blood tests. Urine tests. Imaging tests. This may include a CT scan. Surgery safety Ask your health care provider: How your surgery site will be marked. What steps will be taken to help prevent infection. These steps may include: Washing skin with a soap that kills germs. Receiving antibiotics. General instructions If you will be going home right after the procedure, plan to have a responsible adult: Take you home from the hospital or clinic. You will not be allowed to drive. Care for you for the time you are told. What happens during the procedure?  An IV will be inserted into one of your veins. You may be given: A sedative. This helps you relax. Anesthesia. This will: Numb certain areas of your body. Make you fall asleep for surgery. A water-filled cushion may be placed behind your kidney or on your abdomen. In some cases, you may be placed in a tub of lukewarm water. Your body will be positioned in a way that makes it   easier to target the kidney stone. An X-ray or ultrasound exam will be done to locate your stone. Shock waves will be aimed at the stone. If you are awake, you may feel a tapping sensation as the shock waves pass through your body. A small mesh tube (stent) may be placed in your  ureter. This will help keep urine flowing from the kidney if the fragments of the stone have been blocking the ureter. The stent will be removed at a later time by your health care provider. The procedure may vary among health care providers and hospitals. What happens after the procedure? Your blood pressure, heart rate, breathing rate, and blood oxygen level will be monitored until you leave the hospital or clinic. You may have an X-ray after the procedure to see how many of the kidney stones were broken up. This will also show how much of the stone has passed. If there are still large fragments after treatment, you may need to have a second procedure at a later time. This information is not intended to replace advice given to you by your health care provider. Make sure you discuss any questions you have with your health care provider. Document Revised: 12/30/2021 Document Reviewed: 12/30/2021 Elsevier Patient Education  2024 Elsevier Inc.  

## 2023-03-31 ENCOUNTER — Other Ambulatory Visit: Payer: Self-pay

## 2023-03-31 ENCOUNTER — Other Ambulatory Visit: Payer: Self-pay | Admitting: Urology

## 2023-03-31 ENCOUNTER — Telehealth: Payer: Self-pay

## 2023-03-31 DIAGNOSIS — N201 Calculus of ureter: Secondary | ICD-10-CM

## 2023-03-31 LAB — MICROSCOPIC EXAMINATION
Bacteria, UA: NONE SEEN
Epithelial Cells (non renal): NONE SEEN /hpf (ref 0–10)
RBC, Urine: NONE SEEN /hpf (ref 0–2)

## 2023-03-31 LAB — URINALYSIS, COMPLETE
Bilirubin, UA: NEGATIVE
Glucose, UA: NEGATIVE
Ketones, UA: NEGATIVE
Leukocytes,UA: NEGATIVE
Nitrite, UA: NEGATIVE
Protein,UA: NEGATIVE
RBC, UA: NEGATIVE
Specific Gravity, UA: 1.02 (ref 1.005–1.030)
Urobilinogen, Ur: 0.2 mg/dL (ref 0.2–1.0)
pH, UA: 6 (ref 5.0–7.5)

## 2023-03-31 LAB — MYCOPLASMA / UREAPLASMA CULTURE

## 2023-03-31 NOTE — Progress Notes (Unsigned)
ESWL ORDER FORM  Expected date of procedure: patient preference, next available  Surgeon: md on truck  Post op standing: 2-4wk follow up w/KUB prior  Anticoagulation/Aspirin/NSAID standing order: Hold all 72 hours prior  Anesthesia standing order: MAC  VTE standing: SCD's  Dx: Left Ureteral Stone  Procedure: left Extracorporeal shock wave lithotripsy  CPT : 50590  Standing Order Set:   *NPO after mn, KUB  *NS 158ml/hr, Keflex 500mg  PO, Benadryl 25mg  PO, Valium 10mg  PO, Zofran 4mg  IV    Medications if other than standing orders:   Keflex 500mg  PO

## 2023-03-31 NOTE — Telephone Encounter (Signed)
Opened in Error.

## 2023-03-31 NOTE — Progress Notes (Unsigned)
LEFT ureteral stone still present on KUB, will schedule LEFT SWL as discussed in clinic  Legrand Rams, MD 03/31/2023

## 2023-04-03 LAB — MYCOPLASMA / UREAPLASMA CULTURE

## 2023-04-03 LAB — CULTURE, URINE COMPREHENSIVE

## 2023-04-12 MED ORDER — SODIUM CHLORIDE 0.9 % IV SOLN: INTRAVENOUS | Status: DC

## 2023-04-12 MED ORDER — ONDANSETRON HCL 4 MG/2ML IJ SOLN
4.0000 mg | Freq: Once | INTRAMUSCULAR | Status: AC
  Administered 2023-04-13: 4 mg via INTRAVENOUS

## 2023-04-12 MED ORDER — DIAZEPAM 5 MG PO TABS
10.0000 mg | ORAL_TABLET | ORAL | Status: AC
  Administered 2023-04-13: 10 mg via ORAL

## 2023-04-12 MED ORDER — CEPHALEXIN 500 MG PO CAPS
500.0000 mg | ORAL_CAPSULE | Freq: Once | ORAL | Status: AC
  Administered 2023-04-13: 500 mg via ORAL

## 2023-04-12 MED ORDER — DIPHENHYDRAMINE HCL 25 MG PO CAPS
25.0000 mg | ORAL_CAPSULE | ORAL | Status: AC
  Administered 2023-04-13: 25 mg via ORAL

## 2023-04-13 ENCOUNTER — Ambulatory Visit: Payer: BC Managed Care – PPO

## 2023-04-13 ENCOUNTER — Ambulatory Visit
Admission: RE | Admit: 2023-04-13 | Discharge: 2023-04-13 | Disposition: A | Discharge status: Home / Self Care | Payer: BC Managed Care – PPO | Attending: Urology | Admitting: Urology

## 2023-04-13 ENCOUNTER — Other Ambulatory Visit: Payer: Self-pay

## 2023-04-13 ENCOUNTER — Encounter
Admission: RE | Disposition: A | Discharge status: Home / Self Care | Payer: Self-pay | Source: Home / Self Care | Attending: Urology

## 2023-04-13 DIAGNOSIS — N201 Calculus of ureter: Secondary | ICD-10-CM | POA: Diagnosis not present

## 2023-04-13 HISTORY — PX: EXTRACORPOREAL SHOCK WAVE LITHOTRIPSY: SHX1557

## 2023-04-13 LAB — POCT PREGNANCY, URINE: Preg Test, Ur: NEGATIVE

## 2023-04-13 SURGERY — LITHOTRIPSY, ESWL
Anesthesia: Moderate Sedation | Laterality: Left

## 2023-04-13 MED ORDER — ONDANSETRON HCL 4 MG/2ML IJ SOLN
INTRAMUSCULAR | Status: AC
  Filled 2023-04-13: qty 2

## 2023-04-13 MED ORDER — DIAZEPAM 5 MG PO TABS
ORAL_TABLET | ORAL | Status: AC
  Filled 2023-04-13: qty 2

## 2023-04-13 MED ORDER — CEPHALEXIN 500 MG PO CAPS
ORAL_CAPSULE | ORAL | Status: AC
  Filled 2023-04-13: qty 1

## 2023-04-13 MED ORDER — DIPHENHYDRAMINE HCL 25 MG PO CAPS
ORAL_CAPSULE | ORAL | Status: AC
  Filled 2023-04-13: qty 1

## 2023-04-13 NOTE — Brief Op Note (Signed)
04/13/2023  8:19 AM  PATIENT:  Jasmine Mcmahon  27 y.o. female  PRE-OPERATIVE DIAGNOSIS:  Left Ureteral Stone  POST-OPERATIVE DIAGNOSIS:  No stone identified  PROCEDURE:  Flouroscopy  SURGEON:  Surgeons and Role:    * Sondra Come, MD - Primary  ANESTHESIA: Conscious Sedation  EBL:  None  Drains: None  Specimen: None  Findings:  No stone identified, contrast passed easily into the bladder with no hydroureteronphrosis Stone may have passed spontaneously, consider CT stone protocol if recurrent stone pain, otherwise recommend referral to pelvic floor physical therapy   DISPO: Pain meds PRN, RTC 2 weeks symptom check  Legrand Rams, MD 04/13/2023

## 2023-04-13 NOTE — Discharge Instructions (Signed)

## 2023-04-13 NOTE — Interval H&P Note (Signed)
UROLOGY H&P UPDATE  Agree with prior H&P dated 03/30/23.  Cardiac: RRR Lungs: CTA bilaterally  Laterality: Left Procedure: shockwave lithotripsy  Urine: 7/18 mixed flora  Informed consent obtained, we specifically discussed the risks of bleeding/hematoma, infection, post-operative pain, obstructive fragments, need for additional procedures including ureteroscopy.  Sondra Come, MD 04/13/2023

## 2023-04-14 ENCOUNTER — Encounter: Payer: Self-pay | Admitting: Urology

## 2023-04-27 ENCOUNTER — Ambulatory Visit: Payer: BC Managed Care – PPO | Admitting: Physician Assistant

## 2023-05-18 ENCOUNTER — Encounter: Payer: Self-pay | Admitting: Physician Assistant

## 2023-05-18 ENCOUNTER — Ambulatory Visit: Payer: BC Managed Care – PPO | Admitting: Physician Assistant

## 2023-09-13 NOTE — L&D Delivery Note (Signed)
 Operative Delivery Note At 2:03 PM a viable female was delivered via Vaginal, Spontaneous.  Presentation: vertex; Position: Right,, Occiput,, Anterior; Station: +5.  Mild shoulder dystocia anticipated given turtling of the head - immediately relieved with McRoberts and Suprapubic [pressure.  Delivery of the head: 08/02/2024  2:03 PM First maneuver: 08/02/2024  2:03 PM, McRoberts Suprapubic Pressure, shoulders cam easily with this    Verbal consent: obtained from patient.  APGAR: 8, 9; weight pending .   Placenta status: , .   Cord:  with the following complications: .  Cord pH: not sent  Anesthesia:  CLE Episiotomy: None Lacerations: 2nd degree Suture Repair: 2.0 3.0 vicryl Est. Blood Loss (mL):    It's a boy - Jasmine Mcmahon!   Mom to postpartum.  Baby to Couplet care / Skin to Skin.  Kelly Nest Remon Quinto 08/02/2024, 2:21 PM

## 2024-01-03 LAB — OB RESULTS CONSOLE RUBELLA ANTIBODY, IGM: Rubella: NON-IMMUNE/NOT IMMUNE

## 2024-01-03 LAB — OB RESULTS CONSOLE HIV ANTIBODY (ROUTINE TESTING): HIV: NONREACTIVE

## 2024-01-03 LAB — OB RESULTS CONSOLE ANTIBODY SCREEN: Antibody Screen: NEGATIVE

## 2024-01-03 LAB — OB RESULTS CONSOLE RPR: RPR: NONREACTIVE

## 2024-01-03 LAB — HEPATITIS C ANTIBODY: HCV Ab: NEGATIVE

## 2024-01-03 LAB — OB RESULTS CONSOLE HEPATITIS B SURFACE ANTIGEN: Hepatitis B Surface Ag: NEGATIVE

## 2024-01-18 LAB — OB RESULTS CONSOLE GC/CHLAMYDIA
Chlamydia: NEGATIVE
Neisseria Gonorrhea: NEGATIVE

## 2024-07-08 LAB — OB RESULTS CONSOLE GBS: GBS: NEGATIVE

## 2024-07-24 ENCOUNTER — Telehealth (HOSPITAL_COMMUNITY): Payer: Self-pay | Admitting: *Deleted

## 2024-07-24 ENCOUNTER — Encounter (HOSPITAL_COMMUNITY): Payer: Self-pay | Admitting: *Deleted

## 2024-07-24 NOTE — Telephone Encounter (Signed)
 Preadmission screen

## 2024-07-26 ENCOUNTER — Telehealth (HOSPITAL_COMMUNITY): Payer: Self-pay | Admitting: *Deleted

## 2024-07-26 NOTE — Telephone Encounter (Signed)
 Preadmission screen

## 2024-07-29 ENCOUNTER — Telehealth (HOSPITAL_COMMUNITY): Payer: Self-pay | Admitting: *Deleted

## 2024-07-29 NOTE — Telephone Encounter (Signed)
 Preadmission screen

## 2024-07-30 ENCOUNTER — Telehealth (HOSPITAL_COMMUNITY): Payer: Self-pay | Admitting: *Deleted

## 2024-07-30 NOTE — Telephone Encounter (Signed)
 Preadmission screen

## 2024-08-01 ENCOUNTER — Inpatient Hospital Stay (HOSPITAL_COMMUNITY)
Admission: AD | Admit: 2024-08-01 | Discharge: 2024-08-04 | DRG: 807 | Disposition: A | Discharge status: Home / Self Care | Attending: Obstetrics & Gynecology | Admitting: Obstetrics & Gynecology

## 2024-08-01 ENCOUNTER — Encounter (HOSPITAL_COMMUNITY): Payer: Self-pay | Admitting: Obstetrics & Gynecology

## 2024-08-01 ENCOUNTER — Other Ambulatory Visit: Payer: Self-pay

## 2024-08-01 DIAGNOSIS — Z8249 Family history of ischemic heart disease and other diseases of the circulatory system: Secondary | ICD-10-CM

## 2024-08-01 DIAGNOSIS — Z3490 Encounter for supervision of normal pregnancy, unspecified, unspecified trimester: Principal | ICD-10-CM

## 2024-08-01 DIAGNOSIS — Z3A40 40 weeks gestation of pregnancy: Secondary | ICD-10-CM

## 2024-08-01 DIAGNOSIS — Z833 Family history of diabetes mellitus: Secondary | ICD-10-CM

## 2024-08-01 NOTE — MAU Note (Signed)
 Jasmine Mcmahon is a 28 y.o. at [redacted]w[redacted]d here in MAU reporting: ctx every 5 minutes since 2230. Reports bloody show throughout the day but the blood has gotten darker. Reports a spot of fluid but denies continuous LOF. +FM  Onset of complaint: 2230 Pain score: 5 Vitals:   08/01/24 2338  BP: 133/80  Pulse: 79  Resp: 18  Temp: 99.3 F (37.4 C)  SpO2: 99%     FHT: 136  Lab orders placed from triage: labor eval

## 2024-08-02 ENCOUNTER — Inpatient Hospital Stay (HOSPITAL_COMMUNITY): Admitting: Anesthesiology

## 2024-08-02 ENCOUNTER — Encounter (HOSPITAL_COMMUNITY): Payer: Self-pay | Admitting: Obstetrics & Gynecology

## 2024-08-02 DIAGNOSIS — O26893 Other specified pregnancy related conditions, third trimester: Secondary | ICD-10-CM | POA: Diagnosis present

## 2024-08-02 DIAGNOSIS — Z8249 Family history of ischemic heart disease and other diseases of the circulatory system: Secondary | ICD-10-CM | POA: Diagnosis not present

## 2024-08-02 DIAGNOSIS — Z833 Family history of diabetes mellitus: Secondary | ICD-10-CM | POA: Diagnosis not present

## 2024-08-02 DIAGNOSIS — Z3A40 40 weeks gestation of pregnancy: Secondary | ICD-10-CM | POA: Diagnosis not present

## 2024-08-02 DIAGNOSIS — Z3490 Encounter for supervision of normal pregnancy, unspecified, unspecified trimester: Principal | ICD-10-CM

## 2024-08-02 LAB — CBC
HCT: 37.7 % (ref 36.0–46.0)
Hemoglobin: 12.6 g/dL (ref 12.0–15.0)
MCH: 29.7 pg (ref 26.0–34.0)
MCHC: 33.4 g/dL (ref 30.0–36.0)
MCV: 88.9 fL (ref 80.0–100.0)
Platelets: 210 K/uL (ref 150–400)
RBC: 4.24 MIL/uL (ref 3.87–5.11)
RDW: 12.6 % (ref 11.5–15.5)
WBC: 10 K/uL (ref 4.0–10.5)
nRBC: 0 % (ref 0.0–0.2)

## 2024-08-02 LAB — POCT FERN TEST: POCT Fern Test: NEGATIVE

## 2024-08-02 LAB — TYPE AND SCREEN
ABO/RH(D): A POS
Antibody Screen: NEGATIVE

## 2024-08-02 LAB — SYPHILIS: RPR W/REFLEX TO RPR TITER AND TREPONEMAL ANTIBODIES, TRADITIONAL SCREENING AND DIAGNOSIS ALGORITHM: RPR Ser Ql: NONREACTIVE

## 2024-08-02 MED ORDER — DIPHENHYDRAMINE HCL 25 MG PO CAPS: 25.0000 mg | ORAL_CAPSULE | Freq: Four times a day (QID) | ORAL | Status: DC | PRN

## 2024-08-02 MED ORDER — CITALOPRAM HYDROBROMIDE 20 MG PO TABS
30.0000 mg | ORAL_TABLET | Freq: Every day | ORAL | Status: DC
  Administered 2024-08-02 – 2024-08-03 (×2): 30 mg via ORAL
  Filled 2024-08-02: qty 2
  Filled 2024-08-02: qty 1
  Filled 2024-08-02: qty 2

## 2024-08-02 MED ORDER — TERBUTALINE SULFATE 1 MG/ML IJ SOLN: 0.2500 mg | Freq: Once | INTRAMUSCULAR | Status: DC | PRN

## 2024-08-02 MED ORDER — ONDANSETRON HCL 4 MG PO TABS: 4.0000 mg | ORAL_TABLET | ORAL | Status: DC | PRN

## 2024-08-02 MED ORDER — OXYCODONE-ACETAMINOPHEN 5-325 MG PO TABS: 2.0000 | ORAL_TABLET | ORAL | Status: DC | PRN

## 2024-08-02 MED ORDER — EPHEDRINE 5 MG/ML INJ: 10.0000 mg | INTRAVENOUS | Status: DC | PRN

## 2024-08-02 MED ORDER — OXYTOCIN-SODIUM CHLORIDE 30-0.9 UT/500ML-% IV SOLN
1.0000 m[IU]/min | INTRAVENOUS | Status: DC
  Administered 2024-08-02: 2 m[IU]/min via INTRAVENOUS
  Filled 2024-08-02: qty 500

## 2024-08-02 MED ORDER — AMMONIA AROMATIC IN INHA
RESPIRATORY_TRACT | Status: AC
  Filled 2024-08-02: qty 10

## 2024-08-02 MED ORDER — LIDOCAINE HCL (PF) 1 % IJ SOLN
INTRAMUSCULAR | Status: DC | PRN
  Administered 2024-08-02 (×2): 4 mL via EPIDURAL

## 2024-08-02 MED ORDER — SENNOSIDES-DOCUSATE SODIUM 8.6-50 MG PO TABS
2.0000 | ORAL_TABLET | ORAL | Status: DC
  Administered 2024-08-03: 2 via ORAL
  Filled 2024-08-02: qty 2

## 2024-08-02 MED ORDER — IBUPROFEN 600 MG PO TABS
600.0000 mg | ORAL_TABLET | Freq: Four times a day (QID) | ORAL | Status: DC
  Administered 2024-08-02 – 2024-08-04 (×7): 600 mg via ORAL
  Filled 2024-08-02 (×7): qty 1

## 2024-08-02 MED ORDER — LACTATED RINGERS IV SOLN
500.0000 mL | Freq: Once | INTRAVENOUS | Status: AC
  Administered 2024-08-02: 500 mL via INTRAVENOUS

## 2024-08-02 MED ORDER — ACETAMINOPHEN 325 MG PO TABS: 650.0000 mg | ORAL_TABLET | ORAL | Status: DC | PRN

## 2024-08-02 MED ORDER — OXYTOCIN BOLUS FROM INFUSION
333.0000 mL | Freq: Once | INTRAVENOUS | Status: AC
  Administered 2024-08-02: 333 mL via INTRAVENOUS

## 2024-08-02 MED ORDER — EPHEDRINE 5 MG/ML INJ
10.0000 mg | INTRAVENOUS | Status: DC | PRN
  Administered 2024-08-02: 10 mg via INTRAVENOUS
  Filled 2024-08-02: qty 5

## 2024-08-02 MED ORDER — ONDANSETRON HCL 4 MG/2ML IJ SOLN: 4.0000 mg | INTRAMUSCULAR | Status: DC | PRN

## 2024-08-02 MED ORDER — ONDANSETRON HCL 4 MG/2ML IJ SOLN
4.0000 mg | Freq: Four times a day (QID) | INTRAMUSCULAR | Status: DC | PRN
Start: 2024-08-02 — End: 2024-08-02

## 2024-08-02 MED ORDER — ACETAMINOPHEN 325 MG PO TABS
650.0000 mg | ORAL_TABLET | ORAL | Status: DC | PRN
  Administered 2024-08-03: 650 mg via ORAL
  Filled 2024-08-02: qty 2

## 2024-08-02 MED ORDER — ZOLPIDEM TARTRATE 5 MG PO TABS: 5.0000 mg | ORAL_TABLET | Freq: Every evening | ORAL | Status: DC | PRN

## 2024-08-02 MED ORDER — PHENYLEPHRINE 80 MCG/ML (10ML) SYRINGE FOR IV PUSH (FOR BLOOD PRESSURE SUPPORT): 80.0000 ug | PREFILLED_SYRINGE | INTRAVENOUS | Status: DC | PRN

## 2024-08-02 MED ORDER — DIBUCAINE (PERIANAL) 1 % EX OINT: 1.0000 | TOPICAL_OINTMENT | CUTANEOUS | Status: DC | PRN

## 2024-08-02 MED ORDER — WITCH HAZEL-GLYCERIN EX PADS: 1.0000 | MEDICATED_PAD | CUTANEOUS | Status: DC | PRN

## 2024-08-02 MED ORDER — SIMETHICONE 80 MG PO CHEW: 80.0000 mg | CHEWABLE_TABLET | ORAL | Status: DC | PRN

## 2024-08-02 MED ORDER — TETANUS-DIPHTH-ACELL PERTUSSIS 5-2-15.5 LF-MCG/0.5 IM SUSP: 0.5000 mL | Freq: Once | INTRAMUSCULAR | Status: DC

## 2024-08-02 MED ORDER — PHENYLEPHRINE 80 MCG/ML (10ML) SYRINGE FOR IV PUSH (FOR BLOOD PRESSURE SUPPORT)
80.0000 ug | PREFILLED_SYRINGE | INTRAVENOUS | Status: DC | PRN
Start: 2024-08-02 — End: 2024-08-02

## 2024-08-02 MED ORDER — OXYCODONE HCL 5 MG PO TABS: 10.0000 mg | ORAL_TABLET | ORAL | Status: DC | PRN

## 2024-08-02 MED ORDER — DIPHENHYDRAMINE HCL 50 MG/ML IJ SOLN: 12.5000 mg | INTRAMUSCULAR | Status: DC | PRN

## 2024-08-02 MED ORDER — FENTANYL-BUPIVACAINE-NACL 0.5-0.125-0.9 MG/250ML-% EP SOLN
12.0000 mL/h | EPIDURAL | Status: DC | PRN
  Administered 2024-08-02: 12 mL/h via EPIDURAL
  Filled 2024-08-02: qty 250

## 2024-08-02 MED ORDER — LACTATED RINGERS IV SOLN: INTRAVENOUS | Status: DC

## 2024-08-02 MED ORDER — FENTANYL CITRATE (PF) 100 MCG/2ML IJ SOLN: 50.0000 ug | INTRAMUSCULAR | Status: DC | PRN

## 2024-08-02 MED ORDER — OXYCODONE HCL 5 MG PO TABS: 5.0000 mg | ORAL_TABLET | ORAL | Status: DC | PRN

## 2024-08-02 MED ORDER — BENZOCAINE-MENTHOL 20-0.5 % EX AERO
1.0000 | INHALATION_SPRAY | CUTANEOUS | Status: DC | PRN
  Filled 2024-08-02: qty 56

## 2024-08-02 MED ORDER — PRENATAL MULTIVITAMIN CH
1.0000 | ORAL_TABLET | Freq: Every day | ORAL | Status: DC
  Administered 2024-08-03: 1 via ORAL
  Filled 2024-08-02: qty 1

## 2024-08-02 MED ORDER — SOD CITRATE-CITRIC ACID 500-334 MG/5ML PO SOLN: 30.0000 mL | ORAL | Status: DC | PRN

## 2024-08-02 MED ORDER — LACTATED RINGERS IV SOLN: 500.0000 mL | INTRAVENOUS | Status: DC | PRN

## 2024-08-02 MED ORDER — COCONUT OIL OIL: 1.0000 | TOPICAL_OIL | Status: DC | PRN

## 2024-08-02 MED ORDER — OXYCODONE-ACETAMINOPHEN 5-325 MG PO TABS: 1.0000 | ORAL_TABLET | ORAL | Status: DC | PRN

## 2024-08-02 MED ORDER — LIDOCAINE HCL (PF) 1 % IJ SOLN: 30.0000 mL | INTRAMUSCULAR | Status: DC | PRN

## 2024-08-02 MED ORDER — OXYTOCIN-SODIUM CHLORIDE 30-0.9 UT/500ML-% IV SOLN: 2.5000 [IU]/h | INTRAVENOUS | Status: DC

## 2024-08-02 NOTE — Lactation Note (Signed)
 This note was copied from a baby's chart. Lactation Consultation Note  Patient Name: Boy Austyn Kasparek Today's Date: 08/02/2024 Age:26 hours  LC made 2nd attempt to see family but MOB was asleep at this time. LC services will make another attempt later today.   Maternal Data    Feeding    LATCH Score                    Lactation Tools Discussed/Used    Interventions    Discharge    Consult Status      Grayce LULLA Batter 08/02/2024, 10:20 PM

## 2024-08-02 NOTE — Progress Notes (Signed)
Complete. Push.

## 2024-08-02 NOTE — Anesthesia Procedure Notes (Signed)
 Epidural Patient location during procedure: OB Start time: 08/02/2024 2:50 AM End time: 08/02/2024 2:53 AM  Staffing Anesthesiologist: Paul Lamarr BRAVO, MD Performed: anesthesiologist   Preanesthetic Checklist Completed: patient identified, IV checked, risks and benefits discussed, monitors and equipment checked, pre-op evaluation and timeout performed  Epidural Patient position: sitting Prep: DuraPrep and site prepped and draped Patient monitoring: continuous pulse ox, blood pressure and heart rate Approach: midline Location: L3-L4 Injection technique: LOR air  Needle:  Needle type: Tuohy  Needle gauge: 17 G Needle length: 9 cm Needle insertion depth: 7 cm Catheter type: closed end flexible Catheter size: 19 Gauge Catheter at skin depth: 12 cm Test dose: negative and Other (1% lidocaine )  Assessment Events: blood not aspirated, no cerebrospinal fluid, injection not painful, no injection resistance, no paresthesia and negative IV test  Additional Notes Patient identified. Risks, benefits, and alternatives discussed with patient including but not limited to bleeding, infection, nerve damage, paralysis, failed block, incomplete pain control, headache, blood pressure changes, nausea, vomiting, reactions to medication, itching, and postpartum back pain. Confirmed with bedside nurse the patient's most recent platelet count. Confirmed with patient that they are not currently taking any anticoagulation, have any bleeding history, or any family history of bleeding disorders. Patient expressed understanding and wished to proceed. All questions were answered. Sterile technique was used throughout the entire procedure. Please see nursing notes for vital signs.   Crisp LOR on first pass. Test dose was given through epidural catheter and negative prior to continuing to dose epidural or start infusion. Warning signs of high block given to the patient including shortness of breath,  tingling/numbness in hands, complete motor block, or any concerning symptoms with instructions to call for help. Patient was given instructions on fall risk and not to get out of bed. All questions and concerns addressed with instructions to call with any issues or inadequate analgesia.  Reason for block:procedure for pain

## 2024-08-02 NOTE — H&P (Signed)
 Jasmine Mcmahon is a 28 y.o. female G1 at [redacted]w[redacted]d presenting for augmentation of labor.  Patient presented to MAU for labor check and did not change her cervix from 4 cm but was very uncomfortable and contracting q 5 minutes.  Antepartum course complicated by h/o nephrolithiasis and Hirchsprung's Dz.  GBS negative.  NIPT low risk.  Currently comfortable with CLEA.   OB History     Gravida  1   Para      Term      Preterm      AB      Living         SAB      IAB      Ectopic      Multiple      Live Births             Past Medical History:  Diagnosis Date   Allergy    Headache    Hirschsprung's disease (HCC)    Kidney stone    Past Surgical History:  Procedure Laterality Date   EXTRACORPOREAL SHOCK WAVE LITHOTRIPSY Left 04/13/2023   Procedure: EXTRACORPOREAL SHOCK WAVE LITHOTRIPSY (ESWL);  Surgeon: Francisca Redell BROCKS, MD;  Location: [facility] ORS;  Service: Urology;  Laterality: Left;   Family History: family history includes Depression in her mother; Diabetes in her mother; Hypertension in her mother. Social History:  reports that she has never smoked. She has never used smokeless tobacco. She reports that she does not drink alcohol and does not use drugs.     Maternal Diabetes: No Genetic Screening: Normal Maternal Ultrasounds/Referrals: Normal Fetal Ultrasounds or other Referrals:  None Maternal Substance Abuse:  No Significant Maternal Medications:  None Significant Maternal Lab Results:  Group B Strep negative Number of Prenatal Visits:greater than 3 verified prenatal visits Maternal Vaccinations:TDap Other Comments:  None  Review of Systems Maternal Medical History:  Reason for admission: Contractions.   Contractions: Onset was 6-12 hours ago.   Frequency: regular.   Perceived severity is moderate.   Fetal activity: Perceived fetal activity is normal.   Last perceived fetal movement was within the past hour.   Prenatal complications: no prenatal  complications Prenatal Complications - Diabetes: none.   Dilation: 4 Effacement (%): 90 Station: -1 Exam by:: Brania Higgs RN Blood pressure 117/66, pulse 77, temperature 98.1 F (36.7 C), temperature source Oral, resp. rate 16, height 5' 6.5 (1.689 m), weight 113.9 kg, SpO2 99%. Maternal Exam:  Uterine Assessment: Contraction strength is moderate.  Contraction frequency is regular.  Abdomen: Patient reports no abdominal tenderness. Fundal height is c/w dates.   Estimated fetal weight is 8#.     Fetal Exam Fetal Monitor Review: Baseline rate: 140.  Variability: moderate (6-25 bpm).   Pattern: accelerations present and no decelerations.   Fetal State Assessment: Category I - tracings are normal.   Physical Exam Constitutional:      Appearance: Normal appearance.  HENT:     Head: Normocephalic and atraumatic.  Pulmonary:     Effort: Pulmonary effort is normal.  Abdominal:     Palpations: Abdomen is soft.  Musculoskeletal:        General: Normal range of motion.     Cervical back: Normal range of motion.  Skin:    General: Skin is warm and dry.  Neurological:     Mental Status: She is alert and oriented to person, place, and time.  Psychiatric:        Mood and Affect: Mood normal.  Behavior: Behavior normal.     Prenatal labs: ABO, Rh: --/--/A POS (11/21 0145) Antibody: NEG (11/21 0145) Rubella: Nonimmune (04/23 0000) RPR: Nonreactive (04/23 0000)  HBsAg: Negative (04/23 0000)  HIV: Non-reactive (04/23 0000)  GBS: Negative/-- (10/27 0000)   Assessment/Plan: 28yo G1 at [redacted]w[redacted]d with latent labor admitted for augmentation -Pitocin  started -Pain well controlled s/p CLEA -Anticipate NSVD  **Patient received CLEA immediately on arrive to L&D.  She had hypotension and prolonged deceleration with occasional subsequent lates which resolved with ephedrine , position change and IVF bolus.     Duwaine Blumenthal 08/02/2024, 6:17 AM

## 2024-08-02 NOTE — Anesthesia Preprocedure Evaluation (Signed)
 Anesthesia Evaluation  Patient identified by MRN, date of birth, ID band Patient awake    Reviewed: Allergy & Precautions, Patient's Chart, lab work & pertinent test results  History of Anesthesia Complications Negative for: history of anesthetic complications  Airway Mallampati: II  TM Distance: >3 FB Neck ROM: Full    Dental no notable dental hx.    Pulmonary neg pulmonary ROS   Pulmonary exam normal        Cardiovascular negative cardio ROS Normal cardiovascular exam     Neuro/Psych  Headaches    GI/Hepatic negative GI ROS, Neg liver ROS,,,  Endo/Other  negative endocrine ROS    Renal/GU negative Renal ROS     Musculoskeletal negative musculoskeletal ROS (+)    Abdominal   Peds  Hematology negative hematology ROS (+)   Anesthesia Other Findings   Reproductive/Obstetrics (+) Pregnancy                              Anesthesia Physical Anesthesia Plan  ASA: 2  Anesthesia Plan: Epidural   Post-op Pain Management:    Induction:   PONV Risk Score and Plan: Treatment may vary due to age or medical condition  Airway Management Planned: Natural Airway  Additional Equipment: Fetal Monitoring  Intra-op Plan:   Post-operative Plan:   Informed Consent: I have reviewed the patients History and Physical, chart, labs and discussed the procedure including the risks, benefits and alternatives for the proposed anesthesia with the patient or authorized representative who has indicated his/her understanding and acceptance.       Plan Discussed with:   Anesthesia Plan Comments:         Anesthesia Quick Evaluation

## 2024-08-02 NOTE — Progress Notes (Signed)
 Comf with CLE. SVE 6/c/-2, AROM thick meconium Continue pitocin  - currently at 8.  Citalopram  nightly for mood d/w - 30mg  ordered for tonight.   FORBES Milian MD

## 2024-08-02 NOTE — Lactation Note (Signed)
 This note was copied from a baby's chart. Lactation Consultation Note  Patient Name: Jasmine Mcmahon Today's Date: 08/02/2024 Age:26 hours Reason for consult: Initial assessment;1st time breastfeeding;Term, LGA infant greater than 9 lbs at birth. P1, Per MOB, infant is latching well, BF 3x since birth. MOB latched infant on her left breast using the football hold position. Infant was still breastfeeding after 13 minutes when LC left the room, MOB knows to ask for further breastfeeding assistance if needed. MOB will continue to breastfeed infant by cues, on demand, 8-12 times per day. Mom made aware of O/P services, breastfeeding support groups, community resources, and our phone # for post-discharge questions.    Maternal Data Does the patient have breastfeeding experience prior to this delivery?: No  Feeding Mother's Current Feeding Choice: Breast Milk  LATCH Score Latch: Grasps breast easily, tongue down, lips flanged, rhythmical sucking.  Audible Swallowing: A few with stimulation  Type of Nipple: Everted at rest and after stimulation  Comfort (Breast/Nipple): Soft / non-tender  Hold (Positioning): Assistance needed to correctly position infant at breast and maintain latch.  LATCH Score: 8   Lactation Tools Discussed/Used    Interventions Interventions: Breast feeding basics reviewed;Assisted with latch;Skin to skin;Hand express;DEBP;Education;LC Services brochure;Guidelines for Milk Supply and Pumping Schedule Handout;CDC Guidelines for Breast Pump Cleaning;CDC milk storage guidelines;LPT handout/interventions  Discharge Pump: DEBP;Personal  Consult Status Consult Status: Follow-up Date: 08/03/24 Follow-up type: In-patient    Grayce LULLA Batter 08/02/2024, 11:11 PM

## 2024-08-03 LAB — CBC
HCT: 30.9 % — ABNORMAL LOW (ref 36.0–46.0)
Hemoglobin: 10.6 g/dL — ABNORMAL LOW (ref 12.0–15.0)
MCH: 30.3 pg (ref 26.0–34.0)
MCHC: 34.3 g/dL (ref 30.0–36.0)
MCV: 88.3 fL (ref 80.0–100.0)
Platelets: 164 K/uL (ref 150–400)
RBC: 3.5 MIL/uL — ABNORMAL LOW (ref 3.87–5.11)
RDW: 12.7 % (ref 11.5–15.5)
WBC: 13.9 K/uL — ABNORMAL HIGH (ref 4.0–10.5)
nRBC: 0 % (ref 0.0–0.2)

## 2024-08-03 NOTE — Progress Notes (Signed)
 MOB was referred for history of anxiety. * Referral screened out by Clinical Social Worker because none of the following criteria appear to apply: ~ History of anxiety/depression during this pregnancy, or of post-partum depression following prior delivery. ~ Diagnosis of anxiety and/or depression within last 3 years OR * MOB's symptoms currently being treated with medication and/or therapy. MOB is treating anxiety symptoms with citalopram  30mg  daily.  Please contact the Clinical Social Worker if needs arise, by [facility] request, or if MOB scores greater than 9/yes to question 10 on Edinburgh Postpartum Depression Screen.  Signed,  Sharyne LOIS Roulette, MSW, LCSWA, LCASA 08/03/2024 10:00 AM

## 2024-08-03 NOTE — Progress Notes (Signed)
 Post Partum Day 1 Subjective: no complaints, up ad lib, voiding, and tolerating PO  Objective: Blood pressure 121/69, pulse 84, temperature 97.7 F (36.5 C), temperature source Oral, resp. rate 18, height 5' 6.5 (1.689 m), weight 113.9 kg, SpO2 99%, unknown if currently breastfeeding.  Physical Exam:  General: alert Lochia: appropriate Uterine Fundus: firm Incision: N/A DVT Evaluation: No evidence of DVT seen on physical exam.  Recent Labs    08/02/24 0150 08/03/24 0458  HGB 12.6 10.6*  HCT 37.7 30.9*    Assessment/Plan: Plan for discharge tomorrow and Circumcision prior to discharge. D/W patient female infant circumcision, risks/benefits reviewed. All questions answered. POSSIBLE d/c later today. Meeting all goals.     LOS: 1 day   Kelly Delon Milian, MD 08/03/2024, 7:09 AM

## 2024-08-03 NOTE — Lactation Note (Signed)
 This note was copied from a baby's chart. Lactation Consultation Note  Patient Name: Jasmine Mcmahon Today's Date: 08/03/2024 Age:26 hours Reason for consult: Follow-up assessment;Mother's request;Primapara;1st time breastfeeding;Term;Breastfeeding assistance  P1- MOB requested latching assistance. MOB reports that infant is still unable to latch to the left breast. Infant was placed on the left breast in the football hold. Infant was irritable and would pop on and off of the left breast. After 6 minutes of trying to sustain a latch on the left breast, MOB became frustrated and switched him to the right breast. Infant latched and sustained the latch after 3 tries on the right breast. MOB requested a manual pump to stimulate the left breast. LC provided her with the manual pump (size 21mm flange). LC reassured MOB that this is common behavior and more than likely it is due to a positional preference. LC encouraged MOB to not give up on the left breast. LC encouraged MOB to set up an outpatient appointment to continue working on latching skills once d/c. MOB will try an IBCLC that will come to her home first, then call our team if needed. LC encouraged MOB to call for further assistance.  Maternal Data Has patient been taught Hand Expression?: Yes Does the patient have breastfeeding experience prior to this delivery?: No  Feeding Mother's Current Feeding Choice: Breast Milk  LATCH Score Latch: Repeated attempts needed to sustain latch, nipple held in mouth throughout feeding, stimulation needed to elicit sucking reflex.  Audible Swallowing: Spontaneous and intermittent  Type of Nipple: Everted at rest and after stimulation  Comfort (Breast/Nipple): Soft / non-tender  Hold (Positioning): Assistance needed to correctly position infant at breast and maintain latch.  LATCH Score: 8   Lactation Tools Discussed/Used Tools: Pump;Flanges Flange Size: 21 Breast pump type: Manual Pump  Education: Setup, frequency, and cleaning;Milk Storage Reason for Pumping: MOB request Pumping frequency: 15-20 min every 3 hrs  Interventions Interventions: Breast feeding basics reviewed;Assisted with latch;Breast compression;Adjust position;Support pillows;Position options;Hand pump;Education;LC Services brochure  Discharge Discharge Education: Engorgement and breast care;Warning signs for feeding baby Pump: DEBP;Manual;Personal  Consult Status Consult Status: Follow-up Date: 08/04/24 Follow-up type: In-patient    Recardo Hoit BS, IBCLC 08/03/2024, 10:20 PM

## 2024-08-03 NOTE — Lactation Note (Signed)
 This note was copied from a baby's chart. Lactation Consultation Note  Patient Name: Jasmine Mcmahon Today's Date: 08/03/2024 Age:26 hours Reason for consult: Follow-up assessment;Mother's request;Primapara;1st time breastfeeding;Term;Breastfeeding assistance  P1- MOB requested latching assistance. MOB reported that infant wasn't latching to either breast and she was unsure why. Infant had stool in his diaper. FOB changed him. LC placed infant on the left breast in the football hold. Infant was mad at first due to the diaper change, but after three attempts, infant was able to latch and sustain the latch. LC encouraged MOB to call for further assistance as needed. Infant was still nursing when [facility] left the room.  Maternal Data Has patient been taught Hand Expression?: No Does the patient have breastfeeding experience prior to this delivery?: No  Feeding Mother's Current Feeding Choice: Breast Milk  LATCH Score Latch: Grasps breast easily, tongue down, lips flanged, rhythmical sucking.  Audible Swallowing: A few with stimulation  Type of Nipple: Everted at rest and after stimulation  Comfort (Breast/Nipple): Soft / non-tender  Hold (Positioning): No assistance needed to correctly position infant at breast.  LATCH Score: 9   Lactation Tools Discussed/Used Pump Education: Milk Storage  Interventions Interventions: Breast feeding basics reviewed;Assisted with latch;Adjust position;Education;LC Services brochure  Discharge Discharge Education: Engorgement and breast care;Warning signs for feeding baby Pump: DEBP;Personal  Consult Status Consult Status: Follow-up Date: 08/04/24 Follow-up type: In-patient    Recardo Hoit BS, IBCLC 08/03/2024, 5:47 PM

## 2024-08-03 NOTE — Anesthesia Postprocedure Evaluation (Signed)
 Anesthesia Post Note  Patient: Jasmine Mcmahon  Procedure(s) Performed: AN AD HOC LABOR EPIDURAL     Patient location during evaluation: Mother Baby Anesthesia Type: Epidural Level of consciousness: awake and alert Pain management: pain level controlled Vital Signs Assessment: post-procedure vital signs reviewed and stable Respiratory status: spontaneous breathing, nonlabored ventilation and respiratory function stable Cardiovascular status: stable Postop Assessment: no headache, no backache and epidural receding Anesthetic complications: no   No notable events documented.  Last Vitals:  Vitals:   08/03/24 0608 08/03/24 1132  BP: 121/69   Pulse: 84   Resp: 18 18  Temp: 36.5 C 37.3 C  SpO2: 99%     Last Pain:  Vitals:   08/03/24 1132  TempSrc: Oral  PainSc:    Pain Goal:                   Loisann Roach

## 2024-08-03 NOTE — Lactation Note (Signed)
 This note was copied from a baby's chart. Lactation Consultation Note  Patient Name: Jasmine Mcmahon Today's Date: 08/03/2024 Age:26 hours Reason for consult: Follow-up assessment;Mother's request;Difficult latch;Primapara;1st time breastfeeding;Term;Breastfeeding assistance  P1- MOB requested latching assistance because infant has only been able to latch to the right breast since birth. Infant was finishing a 15 minute feeding on the right breast when LC entered the room. LC had MOB take infant off of the breast. LC burped infant and checked his diaper for stimulation. LC placed infant on the left breast in the football hold. MOB rolled her nipple for stimulation and latched him with the first attempt. MOB was upset because she felt like a liar. LC reassured MOB that this is normal and more than likely infant was just trying to figure out feedings for the first 24 hrs. LC praised MOB and infant for their great latch.   MOB requested for LC to review how to assessable and use her Spectra . This LC is knowledgeable with the workings of this pump, so LC reviewed how to assessable and how to use the pump. LC also reviewed where to order flange inserts for the pump. LC reviewed the expected day 2 cluster feeding and encouraged MOB to call for further assistance as needed.  Maternal Data Has patient been taught Hand Expression?: No Does the patient have breastfeeding experience prior to this delivery?: No  Feeding Mother's Current Feeding Choice: Breast Milk  LATCH Score Latch: Grasps breast easily, tongue down, lips flanged, rhythmical sucking.  Audible Swallowing: Spontaneous and intermittent  Type of Nipple: Everted at rest and after stimulation  Comfort (Breast/Nipple): Soft / non-tender  Hold (Positioning): No assistance needed to correctly position infant at breast.  LATCH Score: 10   Lactation Tools Discussed/Used Pump Education: Milk Storage  Interventions Interventions:  Breast feeding basics reviewed;Education;DEBP;LC Services brochure  Discharge Discharge Education: Engorgement and breast care;Warning signs for feeding baby Pump: DEBP;Personal  Consult Status Consult Status: Follow-up Date: 08/04/24 Follow-up type: In-patient    Recardo Hoit BS, IBCLC 08/03/2024, 3:34 PM

## 2024-08-04 MED ORDER — MEASLES, MUMPS & RUBELLA VAC ~~LOC~~ SUSR
0.5000 mL | Freq: Once | SUBCUTANEOUS | Status: AC
  Administered 2024-08-04: 0.5 mL via SUBCUTANEOUS
  Filled 2024-08-04: qty 0.5

## 2024-08-04 NOTE — Lactation Note (Signed)
 This note was copied from a baby's chart. Lactation Consultation Note  Patient Name: Jasmine Mcmahon Today's Date: 08/04/2024 Age:26 hours Reason for consult: Follow-up assessment;Term;1st time breastfeeding  P1, Mother states baby is still having difficulty latching on the left breast.  Mother is latching baby on the R side in football hold. After prepumping, hand expressing suggest latching baby in cross cradle hold on the left side.  Offered to assist if needed.  Reviewed engorgement care and monitoring voids/stools. Mother has OP Lactation assistance.    Maternal Data Has patient been taught Hand Expression?: Yes  Feeding Mother's Current Feeding Choice: Breast Milk  Interventions Interventions: Breast feeding basics reviewed;Education  Discharge Discharge Education: Engorgement and breast care;Warning signs for feeding baby  Consult Status Consult Status: Complete Date: 08/04/24   Shannon Dines Boschen  RN, IBCLC 08/04/2024, 11:47 AM

## 2024-08-04 NOTE — Discharge Summary (Signed)
 Postpartum Discharge Summary  Date of Service updated 08/04/24     Patient Name: Jasmine Mcmahon DOB: 05/27/1996 MRN: [id number]  Date of admission: 08/01/2024 Delivery date:08/02/2024 Delivering provider: MARNE KELLY NEST Date of discharge: 08/04/2024  Admitting diagnosis: Pregnancy [Z34.90] Intrauterine pregnancy: [redacted]w[redacted]d     Secondary diagnosis:  Principal Problem:   Pregnancy  Additional problems: none    Discharge diagnosis: Term Pregnancy Delivered                                              Post partum procedures:None Augmentation: AROM and Pitocin  Complications: None  Hospital course: Onset of Labor With Vaginal Delivery      28 y.o. yo G1P1001 at [redacted]w[redacted]d was admitted in Latent Labor on 08/01/2024. Labor course was complicated by none  Membrane Rupture Time/Date: 8:00 AM,08/02/2024  Delivery Method:Vaginal, Spontaneous Operative Delivery:N/A Episiotomy: None Lacerations:  2nd degree Patient had a postpartum course complicated by noen.  She is ambulating, tolerating a regular diet, passing flatus, and urinating well. Patient is discharged home in stable condition on 08/04/24.  Newborn Data: Birth date:08/02/2024 Birth time:2:03 PM Gender:Female Living status:Living Apgars:8 ,9  Weight:4820 g  Magnesium Sulfate received: No BMZ received: No Rhophylac:N/A  Immunizations administered: Immunization History  Administered Date(s) Administered   Tdap 04/18/2016    Physical exam  Vitals:   08/03/24 0608 08/03/24 1132 08/03/24 2128 08/04/24 0521  BP: 121/69  122/69 (!) 116/55  Pulse: 84  80 60  Resp: 18 18 18 18   Temp: 97.7 F (36.5 C) 99.1 F (37.3 C) 98 F (36.7 C) 98.1 F (36.7 C)  TempSrc: Oral Oral Oral Oral  SpO2: 99%     Weight:      Height:       General: alert, cooperative, and no distress Lochia: appropriate Uterine Fundus: firm Incision: N/A DVT Evaluation: No evidence of DVT seen on physical exam. Labs: Lab Results  Component  Value Date   WBC 13.9 (H) 08/03/2024   HGB 10.6 (L) 08/03/2024   HCT 30.9 (L) 08/03/2024   MCV 88.3 08/03/2024   PLT 164 08/03/2024      Latest Ref Rng & Units 11/14/2022    4:44 PM  CMP  Glucose 70 - 99 mg/dL 94   BUN 6 - 20 mg/dL 8   Creatinine 9.55 - 8.99 mg/dL 9.06   Sodium 864 - 854 mmol/L 138   Potassium 3.5 - 5.1 mmol/L 3.3   Chloride 98 - 111 mmol/L 104   CO2 22 - 32 mmol/L 22   Calcium 8.9 - 10.3 mg/dL 9.4    Edinburgh Score:    08/02/2024    7:54 PM  Edinburgh Postnatal Depression Scale Screening Tool  I have been able to laugh and see the funny side of things. --      After visit meds:  Allergies as of 08/04/2024   No Known Allergies      Medication List     TAKE these medications    citalopram  10 MG tablet Commonly known as: CELEXA  Take 10 mg by mouth daily.         Discharge home in stable condition Infant Feeding: Bottle and Breast Infant Disposition:home with mother Discharge instruction: per After Visit Summary and Postpartum booklet. Activity: Advance as tolerated. Pelvic rest for 6 weeks.  Diet: routine diet Anticipated Birth Control: Unsure Postpartum  Appointment:6 weeks Additional Postpartum F/U: None Future Appointments:No future appointments. Follow up Visit:      08/04/2024 Kelly Delon Milian, MD

## 2024-08-07 ENCOUNTER — Inpatient Hospital Stay (HOSPITAL_COMMUNITY)

## 2024-08-07 ENCOUNTER — Inpatient Hospital Stay (HOSPITAL_COMMUNITY): Admission: RE | Admit: 2024-08-07 | Source: Home / Self Care | Admitting: Obstetrics and Gynecology

## 2024-08-13 ENCOUNTER — Telehealth (HOSPITAL_COMMUNITY): Payer: Self-pay | Admitting: *Deleted

## 2024-08-13 NOTE — Telephone Encounter (Signed)
 08/13/2024  Name: PATINA SPANIER MRN: [id number] DOB: 03/08/1996  Reason for Call:  Transition of Care Hospital Discharge Call  Contact Status: Patient Contact Status: Message  Language assistant needed:          Follow-Up Questions:    [place] Postnatal Depression Scale:  In the Past 7 Days:    PHQ2-9 Depression Scale:     Discharge Follow-up:    Post-discharge interventions: NA  Mliss Sieve, RN 08/13/2024 15:02
